# Patient Record
Sex: Male | Born: 1990 | Race: White | Hispanic: No | Smoking: Current some day smoker
Health system: Southern US, Community
[De-identification: ages and names within clinical notes are randomized; demographics above are authoritative.]

## PROBLEM LIST (undated history)

## (undated) DIAGNOSIS — F419 Anxiety disorder, unspecified: Secondary | ICD-10-CM

## (undated) DIAGNOSIS — G5600 Carpal tunnel syndrome, unspecified upper limb: Secondary | ICD-10-CM

## (undated) DIAGNOSIS — F909 Attention-deficit hyperactivity disorder, unspecified type: Secondary | ICD-10-CM

## (undated) DIAGNOSIS — I10 Essential (primary) hypertension: Secondary | ICD-10-CM

---

## 2015-07-31 DIAGNOSIS — I10 Essential (primary) hypertension: Secondary | ICD-10-CM | POA: Diagnosis not present

## 2016-01-15 DIAGNOSIS — R350 Frequency of micturition: Secondary | ICD-10-CM | POA: Diagnosis not present

## 2016-06-17 DIAGNOSIS — F419 Anxiety disorder, unspecified: Secondary | ICD-10-CM | POA: Diagnosis not present

## 2016-07-19 DIAGNOSIS — F419 Anxiety disorder, unspecified: Secondary | ICD-10-CM | POA: Diagnosis not present

## 2016-07-19 DIAGNOSIS — I10 Essential (primary) hypertension: Secondary | ICD-10-CM | POA: Diagnosis not present

## 2016-09-20 DIAGNOSIS — F419 Anxiety disorder, unspecified: Secondary | ICD-10-CM | POA: Diagnosis not present

## 2016-09-20 DIAGNOSIS — I10 Essential (primary) hypertension: Secondary | ICD-10-CM | POA: Diagnosis not present

## 2016-11-22 DIAGNOSIS — Z131 Encounter for screening for diabetes mellitus: Secondary | ICD-10-CM | POA: Diagnosis not present

## 2016-11-22 DIAGNOSIS — Z0000 Encounter for general adult medical examination without abnormal findings: Secondary | ICD-10-CM | POA: Diagnosis not present

## 2016-11-22 DIAGNOSIS — Z13220 Encounter for screening for lipoid disorders: Secondary | ICD-10-CM | POA: Diagnosis not present

## 2016-11-22 DIAGNOSIS — Z23 Encounter for immunization: Secondary | ICD-10-CM | POA: Diagnosis not present

## 2018-02-06 DIAGNOSIS — Z23 Encounter for immunization: Secondary | ICD-10-CM | POA: Diagnosis not present

## 2018-02-06 DIAGNOSIS — M25561 Pain in right knee: Secondary | ICD-10-CM | POA: Diagnosis not present

## 2018-02-06 DIAGNOSIS — F419 Anxiety disorder, unspecified: Secondary | ICD-10-CM | POA: Diagnosis not present

## 2018-02-06 DIAGNOSIS — I10 Essential (primary) hypertension: Secondary | ICD-10-CM | POA: Diagnosis not present

## 2018-12-21 DIAGNOSIS — Z20828 Contact with and (suspected) exposure to other viral communicable diseases: Secondary | ICD-10-CM | POA: Diagnosis not present

## 2019-01-18 DIAGNOSIS — Z20828 Contact with and (suspected) exposure to other viral communicable diseases: Secondary | ICD-10-CM | POA: Diagnosis not present

## 2019-01-31 DIAGNOSIS — Z6841 Body Mass Index (BMI) 40.0 and over, adult: Secondary | ICD-10-CM | POA: Diagnosis not present

## 2019-01-31 DIAGNOSIS — G4719 Other hypersomnia: Secondary | ICD-10-CM | POA: Diagnosis not present

## 2019-01-31 DIAGNOSIS — F419 Anxiety disorder, unspecified: Secondary | ICD-10-CM | POA: Diagnosis not present

## 2019-01-31 DIAGNOSIS — M25561 Pain in right knee: Secondary | ICD-10-CM | POA: Diagnosis not present

## 2019-02-19 DIAGNOSIS — M25561 Pain in right knee: Secondary | ICD-10-CM | POA: Diagnosis not present

## 2019-02-26 ENCOUNTER — Other Ambulatory Visit: Payer: Self-pay

## 2019-02-26 ENCOUNTER — Encounter: Payer: Self-pay | Admitting: Neurology

## 2019-02-26 DIAGNOSIS — R202 Paresthesia of skin: Secondary | ICD-10-CM

## 2019-03-07 DIAGNOSIS — R0681 Apnea, not elsewhere classified: Secondary | ICD-10-CM | POA: Diagnosis not present

## 2019-03-20 ENCOUNTER — Ambulatory Visit: Payer: BC Managed Care – PPO | Admitting: Neurology

## 2019-03-20 ENCOUNTER — Other Ambulatory Visit: Payer: Self-pay

## 2019-03-20 DIAGNOSIS — R202 Paresthesia of skin: Secondary | ICD-10-CM | POA: Diagnosis not present

## 2019-03-20 DIAGNOSIS — G5603 Carpal tunnel syndrome, bilateral upper limbs: Secondary | ICD-10-CM

## 2019-03-20 NOTE — Procedures (Signed)
[facility]  [address], [address]  [place], [place] [address] Tel: [phone] Fax:  [phone] Test Date:  03/20/2019  Patient: James Duke DOB: [birth year] Physician: Nita Sickle, DO  Sex: Male Height: 5\' 8"  Ref Phys: , MD  ID#: [id number] Temp: 33.0C Technician:    Patient Complaints: This is a 29 year old man referred for evaluation of bilateral hand numbness and tingling.  NCV & EMG Findings: Extensive electrodiagnostic testing of the right upper extremity and additional studies of the left shows:  1. Bilateral median sensory responses show prolonged latency (R6.8, L4.0 ms) and reduced amplitude (R11.3, L12.3 V).  Bilateral ulnar sensory responses are within normal limits. 2. Bilateral median motor responses show prolonged latency (R8.4, L5.2 ms).  Bilateral ulnar motor responses are within normal limits. 3. 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  These findings are not present on the left side.  Impression: Bilateral median neuropathy at or distal to the wrist, consistent with a clinical diagnosis of carpal tunnel syndrome.  Overall, these findings are moderate-to-severe and worse on the right.   ___________________________ 26, DO    Nerve Conduction Studies Anti Sensory Summary Table   Stim Site NR Peak (ms) Norm Peak (ms) P-T Amp (V) Norm P-T Amp  Left Median Anti Sensory (2nd Digit)  33C  Wrist    4.0 <3.3 12.3 >20  Right Median Anti Sensory (2nd Digit)  33C  Wrist    6.8 <3.3 11.3 >20  Left Ulnar Anti Sensory (5th Digit)  33C  Wrist    2.5 <3.0 38.4 >18  Right Ulnar Anti Sensory (5th Digit)  33C  Wrist    2.6 <3.0 35.6 >18   Motor Summary Table   Stim Site NR Onset (ms) Norm Onset (ms) O-P Amp (mV) Norm O-P Amp Site1 Site2 Delta-0 (ms) Dist (cm) Vel (m/s) Norm Vel (m/s)  Left Median Motor (Abd Poll Brev)  33C  Wrist     5.2 <3.9 7.1 >6 Elbow Wrist 5.5 31.0 56 >51  Elbow    10.7  6.8         Right Median Motor (Abd Poll Brev)  33C  Wrist    8.4 <3.9 7.1 >6 Elbow Wrist 5.4 31.0 57 >51  Elbow    13.8  6.5         Left Ulnar Motor (Abd Dig Minimi)  33C  Wrist    2.0 <3.0 9.3 >8 B Elbow Wrist 3.9 24.0 62 >51  B Elbow    5.9  9.0  A Elbow B Elbow 1.6 10.0 63 >51  A Elbow    7.5  8.7         Right Ulnar Motor (Abd Dig Minimi)  33C  Wrist    2.0 <3.0 9.8 >8 B Elbow Wrist 3.6 24.0 67 >51  B Elbow    5.6  9.6  A Elbow B Elbow 1.5 10.0 67 >51  A Elbow    7.1  9.6          EMG   Side Muscle Ins Act Fibs Psw Fasc Number Recrt Dur Dur. Amp Amp. Poly Poly. Comment  Right 1stDorInt Nml Nml Nml Nml Nml Nml Nml Nml Nml Nml Nml Nml N/A  Right Abd Poll Brev Nml Nml Nml Nml 1- Rapid Few 1+ Few 1+ Nml Nml N/A  Right PronatorTeres Nml Nml Nml Nml Nml Nml Nml Nml Nml Nml Nml Nml  N/A  Right Biceps Nml Nml Nml Nml Nml Nml Nml Nml Nml Nml Nml Nml N/A  Right Triceps Nml Nml Nml Nml Nml Nml Nml Nml Nml Nml Nml Nml N/A  Right Deltoid Nml Nml Nml Nml Nml Nml Nml Nml Nml Nml Nml Nml N/A  Left 1stDorInt Nml Nml Nml Nml Nml Nml Nml Nml Nml Nml Nml Nml N/A  Left Abd Poll Brev Nml Nml Nml Nml Nml Nml Nml Nml Nml Nml Nml Nml N/A  Left PronatorTeres Nml Nml Nml Nml Nml Nml Nml Nml Nml Nml Nml Nml N/A  Left Biceps Nml Nml Nml Nml Nml Nml Nml Nml Nml Nml Nml Nml N/A  Left Triceps Nml Nml Nml Nml Nml Nml Nml Nml Nml Nml Nml Nml N/A  Left Deltoid Nml Nml Nml Nml Nml Nml Nml Nml Nml Nml Nml Nml N/A      Waveforms:

## 2019-04-01 DIAGNOSIS — G4733 Obstructive sleep apnea (adult) (pediatric): Secondary | ICD-10-CM | POA: Diagnosis not present

## 2019-04-02 DIAGNOSIS — G4733 Obstructive sleep apnea (adult) (pediatric): Secondary | ICD-10-CM | POA: Diagnosis not present

## 2019-04-23 DIAGNOSIS — M25531 Pain in right wrist: Secondary | ICD-10-CM | POA: Diagnosis not present

## 2019-04-23 DIAGNOSIS — M25532 Pain in left wrist: Secondary | ICD-10-CM | POA: Diagnosis not present

## 2019-04-26 DIAGNOSIS — F419 Anxiety disorder, unspecified: Secondary | ICD-10-CM | POA: Diagnosis not present

## 2019-06-06 DIAGNOSIS — F419 Anxiety disorder, unspecified: Secondary | ICD-10-CM | POA: Diagnosis not present

## 2019-08-02 ENCOUNTER — Other Ambulatory Visit: Payer: Self-pay

## 2019-08-02 ENCOUNTER — Encounter (HOSPITAL_COMMUNITY): Payer: Self-pay

## 2019-08-02 ENCOUNTER — Ambulatory Visit (HOSPITAL_COMMUNITY)
Admission: EM | Admit: 2019-08-02 | Discharge: 2019-08-02 | Disposition: A | Discharge status: Home / Self Care | Payer: BC Managed Care – PPO | Attending: Family Medicine | Admitting: Family Medicine

## 2019-08-02 ENCOUNTER — Ambulatory Visit (INDEPENDENT_AMBULATORY_CARE_PROVIDER_SITE_OTHER): Payer: BC Managed Care – PPO

## 2019-08-02 DIAGNOSIS — M7989 Other specified soft tissue disorders: Secondary | ICD-10-CM | POA: Diagnosis not present

## 2019-08-02 DIAGNOSIS — M25572 Pain in left ankle and joints of left foot: Secondary | ICD-10-CM

## 2019-08-02 DIAGNOSIS — S93402A Sprain of unspecified ligament of left ankle, initial encounter: Secondary | ICD-10-CM

## 2019-08-02 HISTORY — DX: Anxiety disorder, unspecified: F41.9

## 2019-08-02 HISTORY — DX: Essential (primary) hypertension: I10

## 2019-08-02 HISTORY — DX: Attention-deficit hyperactivity disorder, unspecified type: F90.9

## 2019-08-02 HISTORY — DX: Carpal tunnel syndrome, unspecified upper limb: G56.00

## 2019-08-02 NOTE — Discharge Instructions (Signed)
Your xray doesn't demonstrate any broken bones, consistent with ankle sprain.  Ice, elevation, ibuprofen or meloxicam, brace and crutches as needed for pain.  Activity as tolerated.  See exercises provided, as able.  Follow up with sports medicine as needed if symptoms persist.

## 2019-08-02 NOTE — ED Triage Notes (Signed)
Pt presents with left foot & ankle pain today; pt states he believes it popped in and out of place this morning.

## 2019-08-02 NOTE — ED Provider Notes (Signed)
[facility]    CSN: [id number] Arrival date & time: 08/02/19  1209      History   Chief Complaint Chief Complaint  Patient presents with   Foot Pain   Ankle Pain    HPI THEDORE PICKEL is a 29 y.o. male.   Winton Offord Debruyne presents with complaints of left foot/ankle pain after he mis-stepped this morning and it felt like it "popped."  Pain has been worsening, worse with weight bearing. No numbness tingling or weakness. Took meloxicam which hasn't helped with pain. May have sprained it in the past, maybe 10 years ago. No other previous injury or surgery.     ROS per HPI, negative if not otherwise mentioned.      Past Medical History:  Diagnosis Date   ADHD    Anxiety    Carpal tunnel syndrome    Hypertension     There are no problems to display for this patient.   History reviewed. No pertinent surgical history.     Home Medications    Prior to Admission medications   Medication Sig Start Date End Date Taking? Authorizing Provider  atomoxetine (STRATTERA) 40 MG capsule Take 40 mg by mouth daily.   Yes [provider]  busPIRone (BUSPAR) 10 MG tablet Take 10 mg by mouth 3 (three) times daily.   Yes [provider]  lisinopril (ZESTRIL) 10 MG tablet Take 10 mg by mouth daily.   Yes [provider]  meloxicam (MOBIC) 15 MG tablet Take 15 mg by mouth daily.   Yes [provider]  sertraline (ZOLOFT) 100 MG tablet Take 100 mg by mouth daily.   Yes [provider]    Family History Family History  Family history unknown: Yes    Social History Social History   Tobacco Use   Smoking status: Current Some Day Smoker  Substance Use Topics   Alcohol use: Not on file   Drug use: Not on file     Allergies   Patient has no known allergies.   Review of Systems Review of Systems   Physical Exam Triage Vital Signs ED Triage Vitals  Enc Vitals Group     BP 08/02/19 1323 (!) 140/78     Pulse Rate  08/02/19 1323 100     Resp 08/02/19 1323 20     Temp 08/02/19 1323 98.4 F (36.9 C)     Temp Source 08/02/19 1323 Oral     SpO2 08/02/19 1323 99 %     Weight --      Height --      Head Circumference --      Peak Flow --      Pain Score 08/02/19 1322 8     Pain Loc --      Pain Edu? --      Excl. in GC? --    No data found.  Updated Vital Signs BP (!) 140/78 (BP Location: Right Arm)    Pulse 100    Temp 98.4 F (36.9 C) (Oral)    Resp 20    SpO2 99%    Physical Exam Constitutional:      Appearance: He is well-developed.  Cardiovascular:     Rate and Rhythm: Normal rate.  Pulmonary:     Effort: Pulmonary effort is normal.  Musculoskeletal:     Left ankle: Tenderness present over the ATF ligament. Decreased range of motion.     Comments: Mild tenderness to left ankle, primarily to  soft tissues; pain with dorsiflexion and supination; strong pulse; cap refill < 2 seconds    Skin:    General: Skin is warm and dry.  Neurological:     Mental Status: He is alert and oriented to person, place, and time.      UC Treatments / Results  Labs (all labs ordered are listed, but only abnormal results are displayed) Labs Reviewed - No data to display  EKG   Radiology DG Ankle Complete Left  Result Date: 08/02/2019 CLINICAL DATA:  Left ankle pain following twisting injury today, initial encounter EXAM: LEFT ANKLE COMPLETE - 3+ VIEW COMPARISON:  None. FINDINGS: Generalized soft tissue swelling is noted. No definitive fracture or dislocation is seen. Small calcaneal spurs are noted. IMPRESSION: Soft tissue swelling about the ankle without definitive fracture. Electronically Signed   By: Alcide Clever M.D.   On: 08/02/2019 14:35    Procedures Procedures (including critical care time)  Medications Ordered in UC Medications - No data to display  Initial Impression / Assessment and Plan / UC Course  I have reviewed the triage vital signs and the nursing notes.  Pertinent labs &  imaging results that were available during my care of the patient were reviewed by me and considered in my medical decision making (see chart for details).     Xray without acute findings. Consistent with sprain. Brace and crutches provided. Follow up recommendations provided. Return precautions provided. Patient verbalized understanding and agreeable to plan.   Final Clinical Impressions(s) / UC Diagnoses   Final diagnoses:  Sprain of left ankle, unspecified ligament, initial encounter     Discharge Instructions     Your xray doesn't demonstrate any broken bones, consistent with ankle sprain.  Ice, elevation, ibuprofen or meloxicam, brace and crutches as needed for pain.  Activity as tolerated.  See exercises provided, as able.  Follow up with sports medicine as needed if symptoms persist.     ED Prescriptions    None     PDMP not reviewed this encounter.   Georgetta Haber, NP 08/02/19 1457

## 2019-08-19 DIAGNOSIS — Z1329 Encounter for screening for other suspected endocrine disorder: Secondary | ICD-10-CM | POA: Diagnosis not present

## 2019-08-19 DIAGNOSIS — Z131 Encounter for screening for diabetes mellitus: Secondary | ICD-10-CM | POA: Diagnosis not present

## 2019-08-19 DIAGNOSIS — Z113 Encounter for screening for infections with a predominantly sexual mode of transmission: Secondary | ICD-10-CM | POA: Diagnosis not present

## 2019-08-19 DIAGNOSIS — Z0000 Encounter for general adult medical examination without abnormal findings: Secondary | ICD-10-CM | POA: Diagnosis not present

## 2019-08-19 DIAGNOSIS — Z13220 Encounter for screening for lipoid disorders: Secondary | ICD-10-CM | POA: Diagnosis not present

## 2020-12-08 IMAGING — DX DG ANKLE COMPLETE 3+V*L*
3 series · 3 of 3 positions shown · non-contrast
Comparison: None.

CLINICAL DATA: Left ankle pain following twisting injury today,
initial encounter

EXAM:
LEFT ANKLE COMPLETE - 3+ VIEW

[ankle ap]
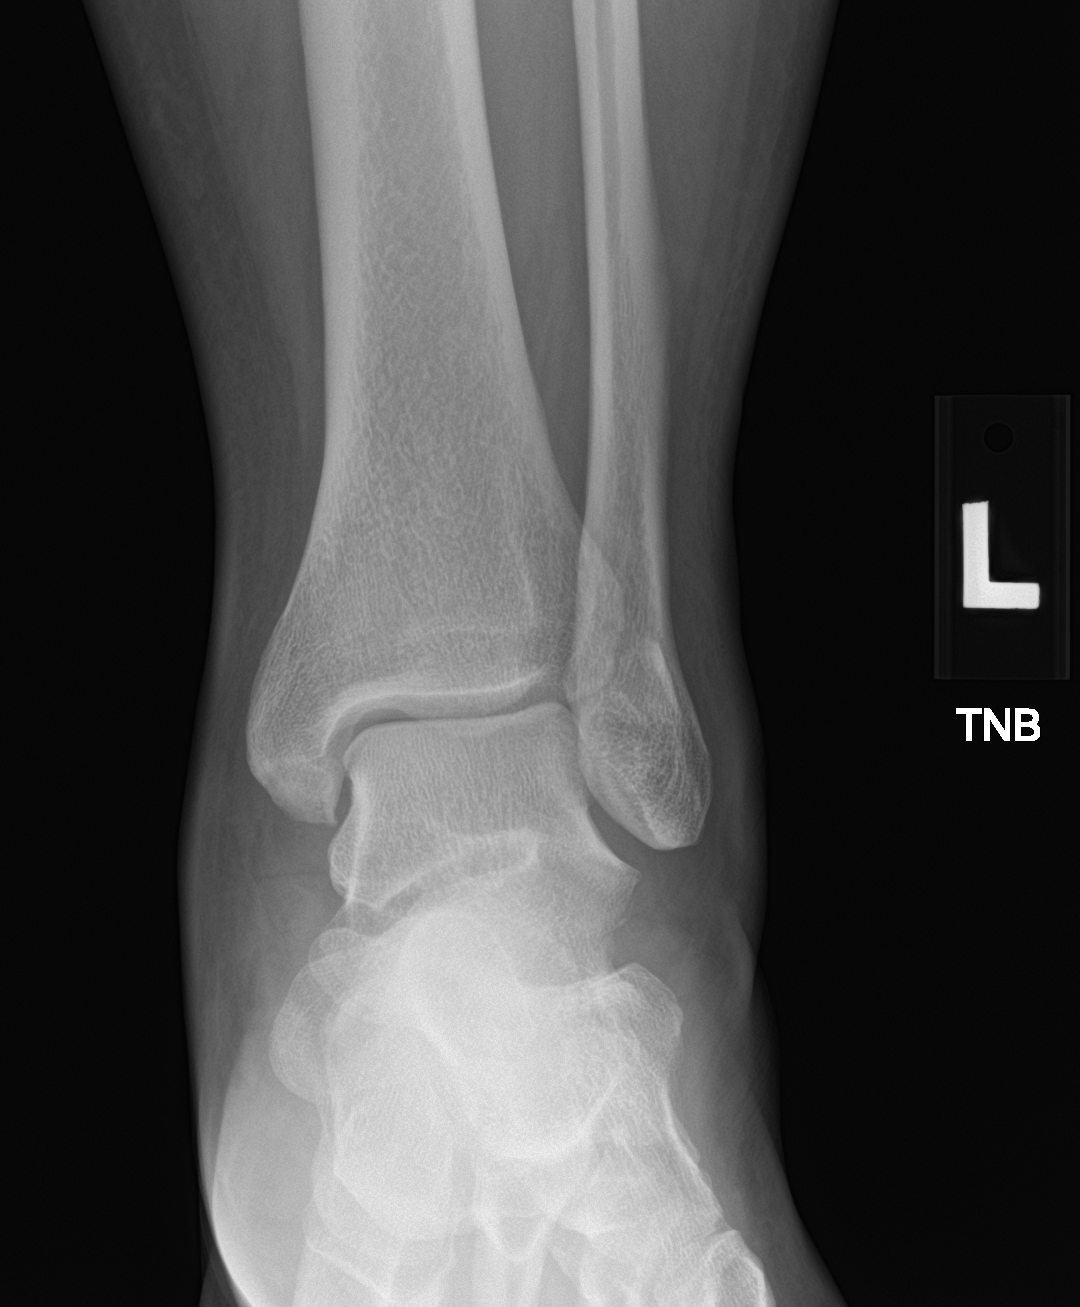

[ankle obl]
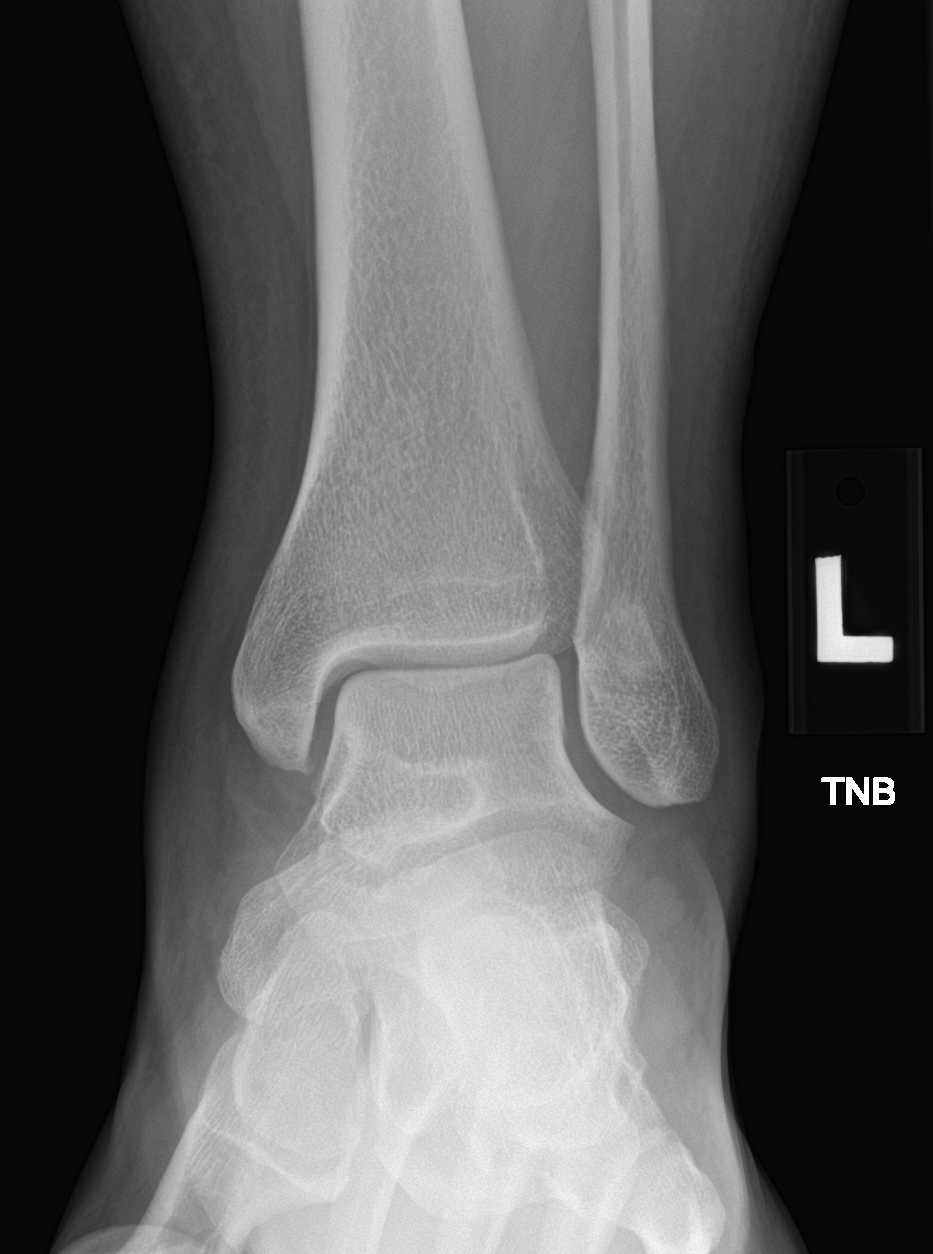

[ankle lat]
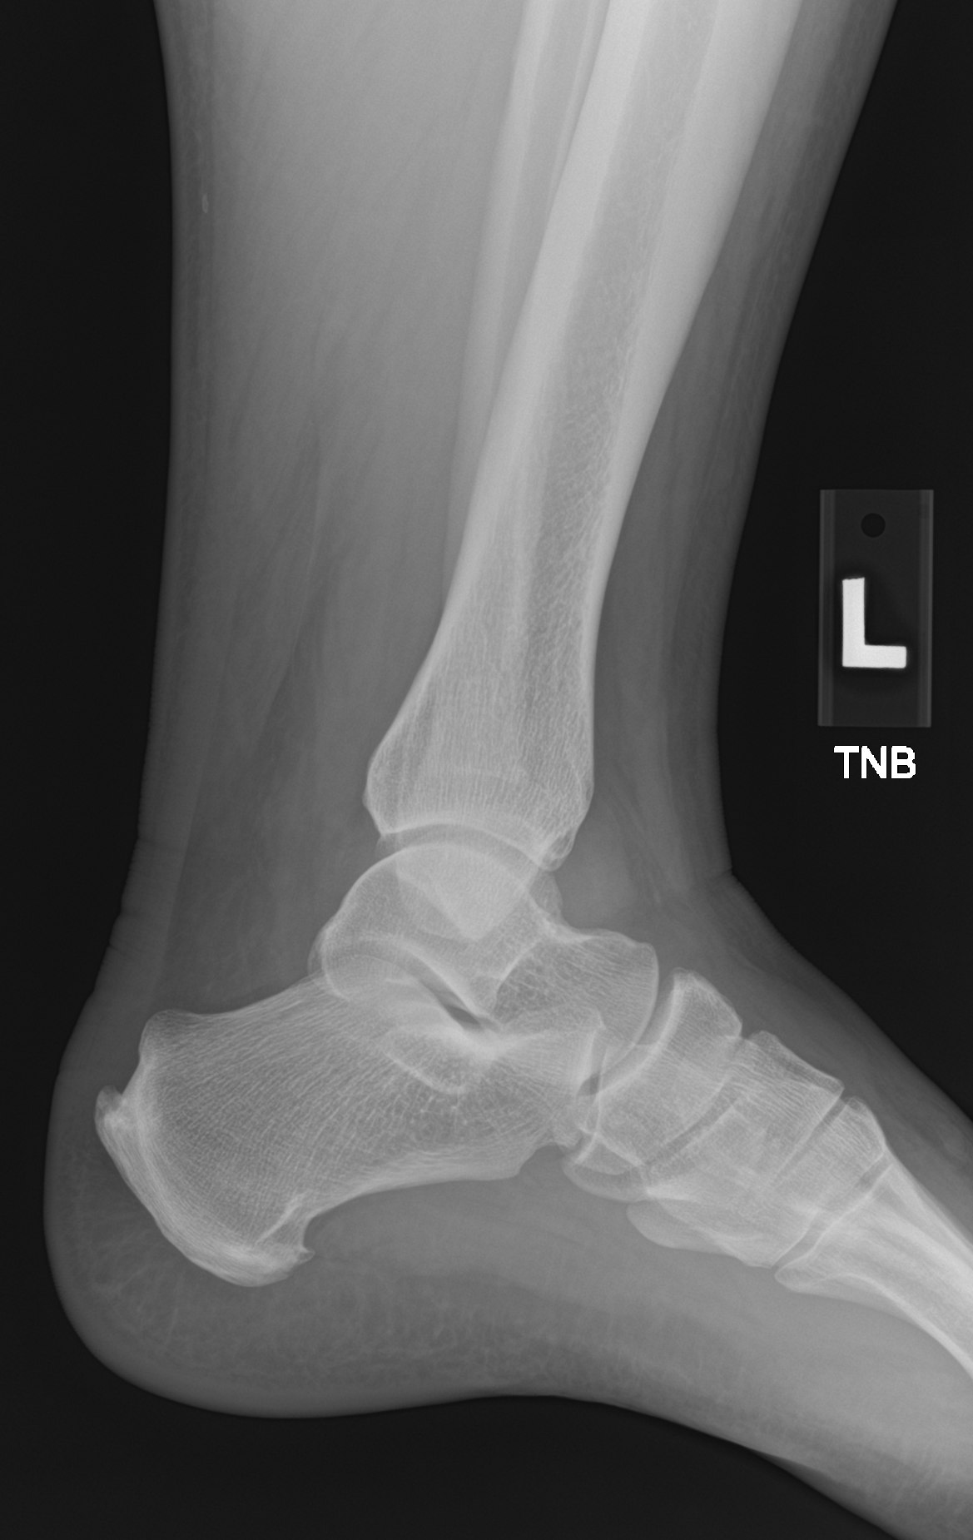

[3 of 3 positions shown; findings below may reference images not displayed]

FINDINGS: Generalized soft tissue swelling is noted. No definitive fracture or
dislocation is seen. Small calcaneal spurs are noted.
IMPRESSION: Soft tissue swelling about the ankle without definitive fracture.
